# Patient Record
Sex: Female | Born: 1985 | Race: Asian | Hispanic: No | Marital: Single
Health system: Southern US, Community
[De-identification: ages and names within clinical notes are randomized; demographics above are authoritative.]

## PROBLEM LIST (undated history)

## (undated) ENCOUNTER — Emergency Department (HOSPITAL_BASED_OUTPATIENT_CLINIC_OR_DEPARTMENT_OTHER): Payer: Self-pay | Source: Home / Self Care

---

## 2014-05-26 ENCOUNTER — Other Ambulatory Visit (HOSPITAL_BASED_OUTPATIENT_CLINIC_OR_DEPARTMENT_OTHER): Payer: Self-pay | Admitting: Internal Medicine

## 2014-05-26 DIAGNOSIS — R102 Pelvic and perineal pain: Secondary | ICD-10-CM

## 2014-06-11 ENCOUNTER — Ambulatory Visit (HOSPITAL_BASED_OUTPATIENT_CLINIC_OR_DEPARTMENT_OTHER): Payer: Self-pay

## 2014-06-28 ENCOUNTER — Ambulatory Visit (HOSPITAL_BASED_OUTPATIENT_CLINIC_OR_DEPARTMENT_OTHER): Admission: RE | Admit: 2014-06-28 | Payer: Self-pay | Source: Ambulatory Visit

## 2014-06-28 ENCOUNTER — Ambulatory Visit (HOSPITAL_BASED_OUTPATIENT_CLINIC_OR_DEPARTMENT_OTHER)
Admission: RE | Admit: 2014-06-28 | Discharge: 2014-06-28 | Disposition: A | Discharge status: Home / Self Care | Payer: Self-pay | Source: Ambulatory Visit | Attending: Internal Medicine | Admitting: Internal Medicine

## 2014-06-28 DIAGNOSIS — R9389 Abnormal findings on diagnostic imaging of other specified body structures: Secondary | ICD-10-CM | POA: Insufficient documentation

## 2014-06-28 DIAGNOSIS — R102 Pelvic and perineal pain: Secondary | ICD-10-CM

## 2014-06-28 DIAGNOSIS — N949 Unspecified condition associated with female genital organs and menstrual cycle: Secondary | ICD-10-CM | POA: Insufficient documentation

## 2016-03-01 IMAGING — US US PELVIS COMPLETE
1 series · 13 of 25 positions shown · non-contrast
Comparison: None

CLINICAL DATA: Pelvic pain for 2 months.

EXAM:
TRANSABDOMINAL AND TRANSVAGINAL ULTRASOUND OF PELVIS
TECHNIQUE: Both transabdominal and transvaginal ultrasound examinations of the
pelvis were performed. Transabdominal technique was performed for
global imaging of the pelvis including uterus, ovaries, adnexal
regions, and pelvic cul-de-sac. It was necessary to proceed with
endovaginal exam following the transabdominal exam to visualize the
uterus, ovaries, and adnexa.

[Series 1: us pelvis complete · 0.33mm/px · 13 of 85 slices shown]
[im 1/85]
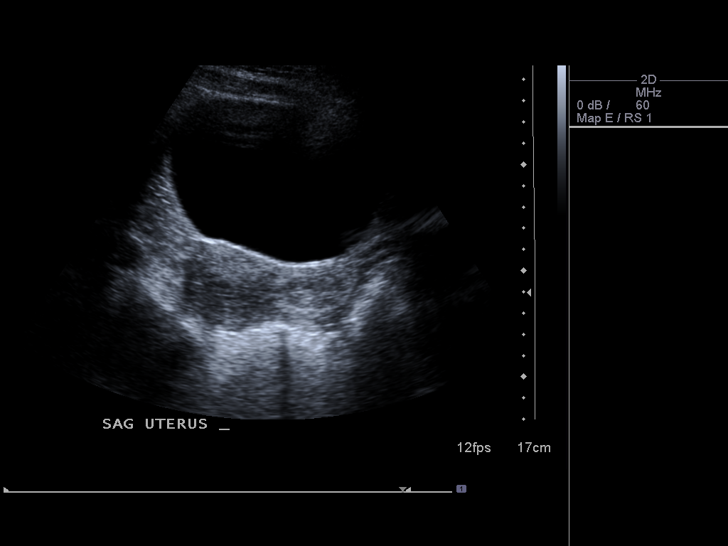
[im 8/85]
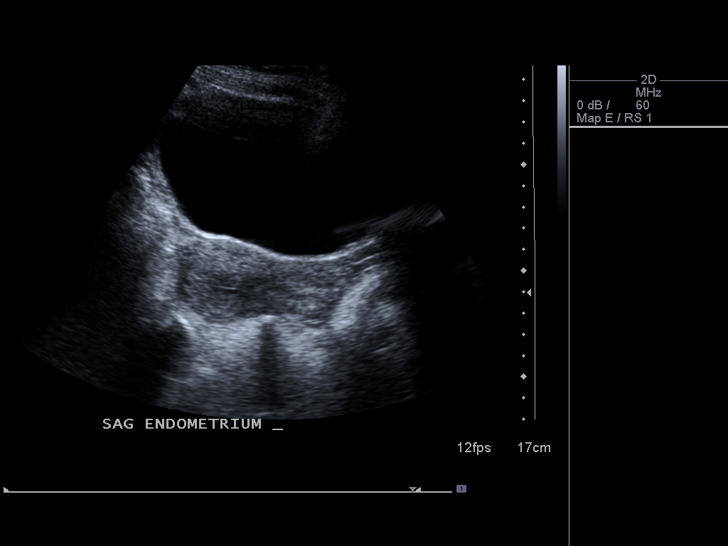
[im 15/85]
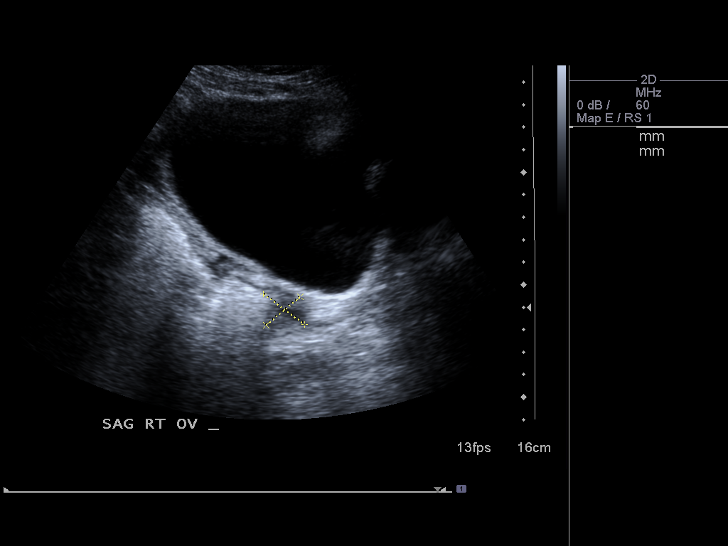
[im 22/85]
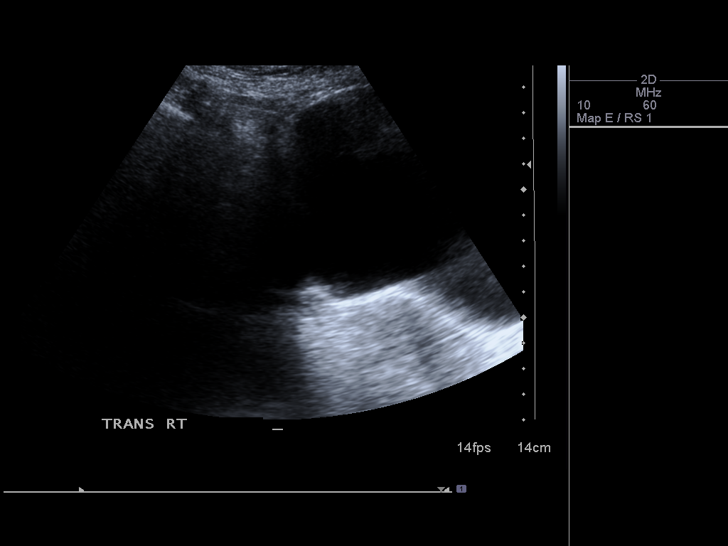
[im 29/85]
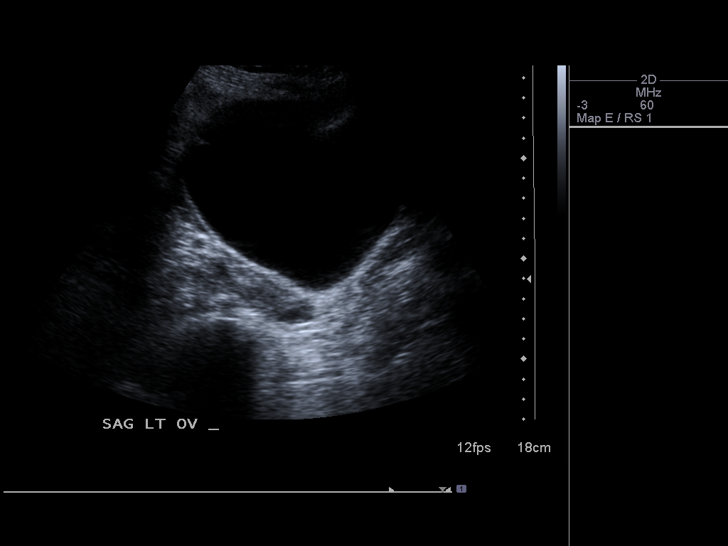
[im 36/85]
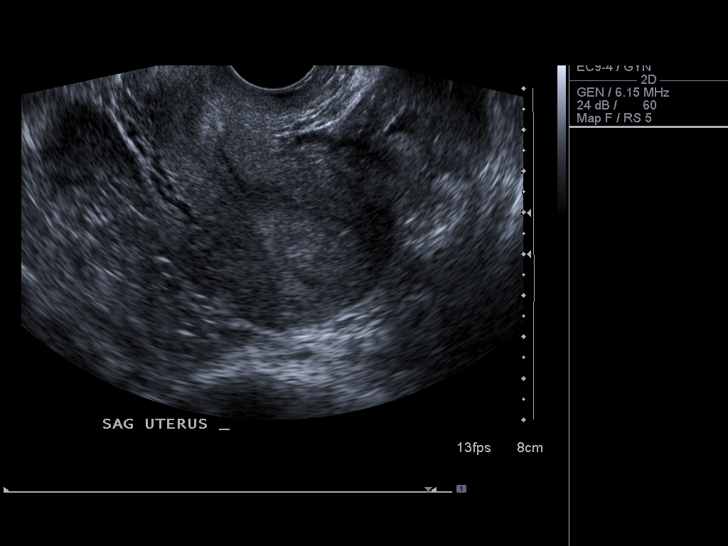
[im 43/85]
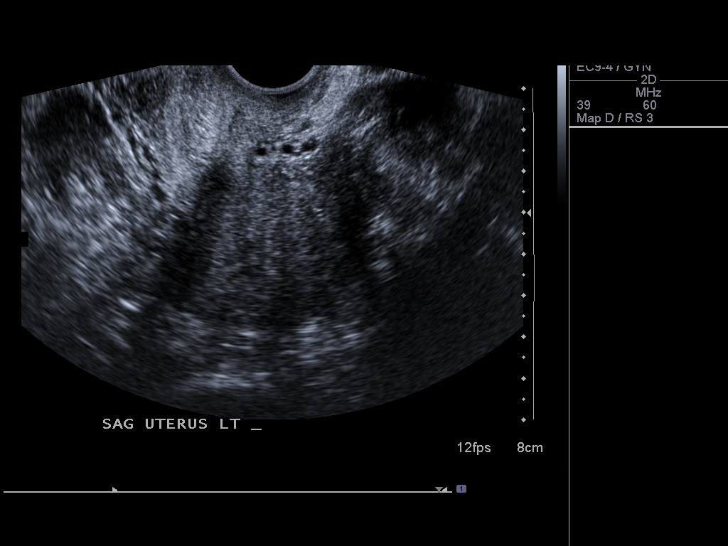
[im 50/85]
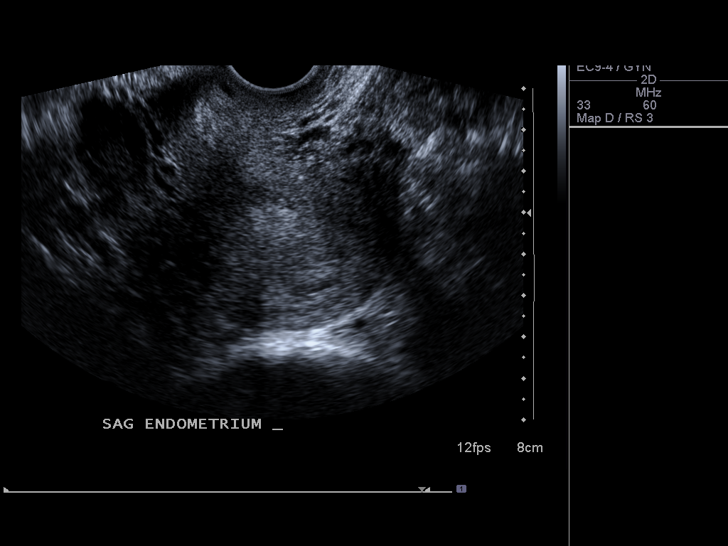
[im 57/85]
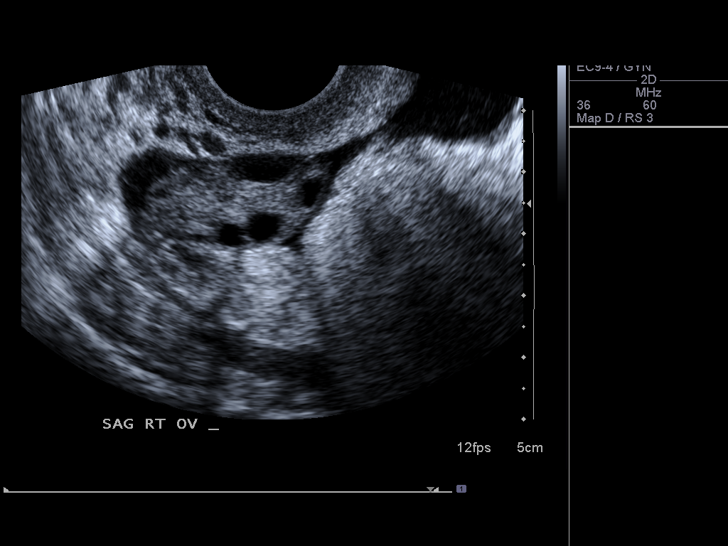
[im 64/85]
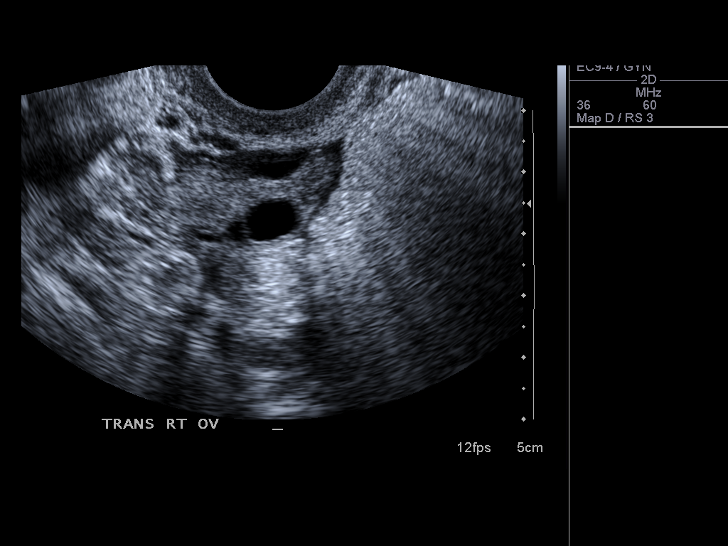
[im 71/85]
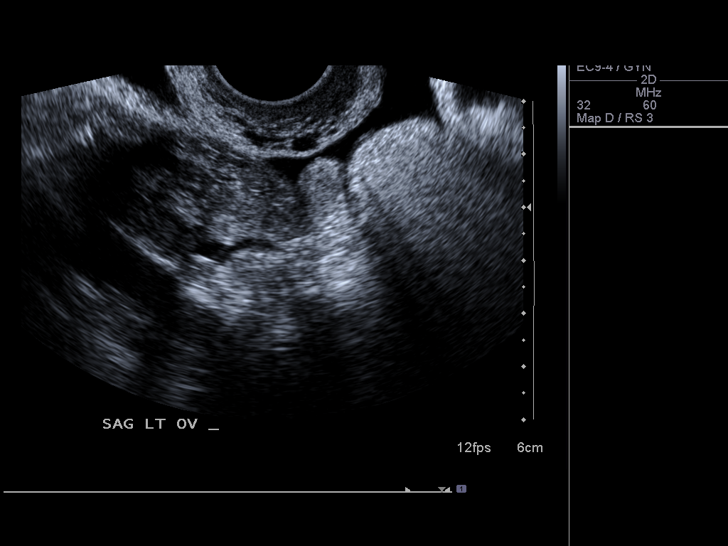
[im 78/85]
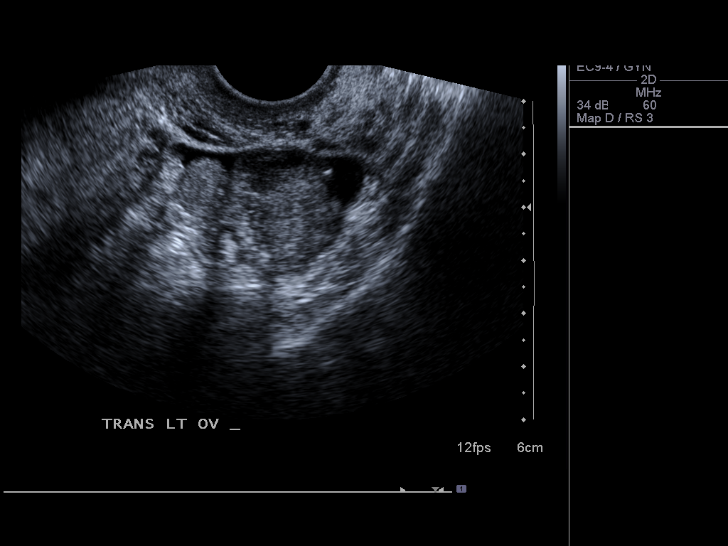
[im 85/85]
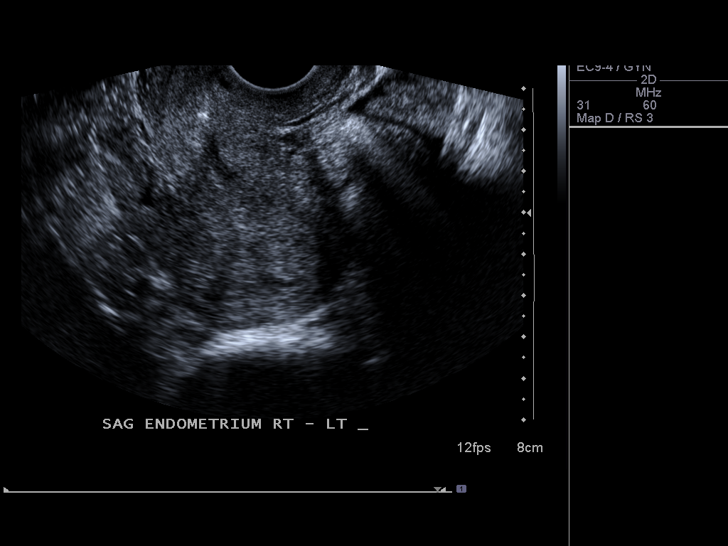

[13 of 25 positions shown; findings below may reference images not displayed]

FINDINGS: Uterus:  8.2 x 4.3 x 4.9 cm.  Retroflexed.

Endometrium: Upper normal, 1.8 cm. Appears more focally thickened in
the region of the fundus. Avascular. Example image 50.

Right Ovary: 3.2 x 1.6 x 2.5 cm. Suspicion of a hypervascular lesion
within, including on image 65. Surrounded by a moderate amount of
free fluid.

Left Ovary:  3.6 x 2.8 x 3.0 cm.  Normal in morphology.

Other Findings:  None
IMPRESSION: 1. Hypervascular area within the right ovary with moderate amount of
surrounding right adnexal fluid. Question recent follicle or corpus
luteal cyst rupture.
2. Endometrial thickness (1.8 cm with an area of more focal
thickening at the fundus) is considered abnormal. Consider follow-up
by US in 6-8 weeks, during the week immediately following menses
(exam timing is critical).
